# Patient Record
Sex: Female | Born: 1960 | Race: White | Marital: Married | Smoking: Never smoker
Health system: Northeastern US, Academic
[De-identification: ages and names within clinical notes are randomized; demographics above are authoritative.]

## PROBLEM LIST (undated history)

## (undated) DIAGNOSIS — K5792 Diverticulitis of intestine, part unspecified, without perforation or abscess without bleeding: Secondary | ICD-10-CM

## (undated) DIAGNOSIS — J45909 Unspecified asthma, uncomplicated: Secondary | ICD-10-CM

## (undated) DIAGNOSIS — T7840XA Allergy, unspecified, initial encounter: Secondary | ICD-10-CM

## (undated) DIAGNOSIS — M1990 Unspecified osteoarthritis, unspecified site: Secondary | ICD-10-CM

## (undated) DIAGNOSIS — Z8742 Personal history of other diseases of the female genital tract: Secondary | ICD-10-CM

## (undated) DIAGNOSIS — Z8619 Personal history of other infectious and parasitic diseases: Secondary | ICD-10-CM

## (undated) DIAGNOSIS — Z8759 Personal history of other complications of pregnancy, childbirth and the puerperium: Secondary | ICD-10-CM

## (undated) HISTORY — DX: Allergy, unspecified, initial encounter: T78.40XA

## (undated) HISTORY — DX: Unspecified osteoarthritis, unspecified site: M19.90

## (undated) HISTORY — DX: Personal history of other diseases of the female genital tract: Z87.42

## (undated) HISTORY — DX: Unspecified asthma, uncomplicated: J45.909

## (undated) HISTORY — DX: Personal history of other infectious and parasitic diseases: Z86.19

## (undated) HISTORY — DX: Diverticulitis of intestine, part unspecified, without perforation or abscess without bleeding: K57.92

## (undated) HISTORY — PX: COLONOSCOPY: SHX174

## (undated) HISTORY — DX: Personal history of other complications of pregnancy, childbirth and the puerperium: Z87.59

---

## 1981-08-13 HISTORY — PX: DILATION AND CURETTAGE OF UTERUS: SHX78

## 1984-08-13 HISTORY — PX: TUBAL LIGATION: SHX77

## 2012-08-13 HISTORY — PX: CHOLECYSTECTOMY: SHX55

## 2020-03-07 ENCOUNTER — Encounter (HOSPITAL_COMMUNITY): Payer: Self-pay | Admitting: Emergency Medicine

## 2020-03-07 ENCOUNTER — Other Ambulatory Visit: Payer: Self-pay

## 2020-03-07 DIAGNOSIS — M25579 Pain in unspecified ankle and joints of unspecified foot: Secondary | ICD-10-CM | POA: Diagnosis not present

## 2020-03-07 DIAGNOSIS — M79604 Pain in right leg: Secondary | ICD-10-CM | POA: Diagnosis not present

## 2020-03-07 DIAGNOSIS — M2342 Loose body in knee, left knee: Secondary | ICD-10-CM | POA: Diagnosis not present

## 2020-03-07 DIAGNOSIS — M25562 Pain in left knee: Secondary | ICD-10-CM | POA: Insufficient documentation

## 2020-03-07 DIAGNOSIS — M79672 Pain in left foot: Secondary | ICD-10-CM | POA: Diagnosis not present

## 2020-03-07 NOTE — ED Triage Notes (Addendum)
Per [facility] pt heard pop and unable to get shoe on left foot due to pains from left knee all the way to toes. Pt has arthritis.  Vitals with WNL 114/50, 84HR, 18R, 95% temp 98.4.

## 2020-03-08 ENCOUNTER — Emergency Department (HOSPITAL_COMMUNITY): Payer: No Typology Code available for payment source

## 2020-03-08 ENCOUNTER — Emergency Department (HOSPITAL_COMMUNITY)
Admission: EM | Admit: 2020-03-08 | Discharge: 2020-03-08 | Disposition: A | Discharge status: Home / Self Care | Payer: No Typology Code available for payment source | Attending: Emergency Medicine | Admitting: Emergency Medicine

## 2020-03-08 DIAGNOSIS — M25562 Pain in left knee: Secondary | ICD-10-CM

## 2020-03-08 DIAGNOSIS — M2342 Loose body in knee, left knee: Secondary | ICD-10-CM

## 2020-03-08 LAB — I-STAT CHEM 8, ED
BUN: 13 mg/dL (ref 6–20)
Calcium, Ion: 1.18 mmol/L (ref 1.15–1.40)
Chloride: 103 mmol/L (ref 98–111)
Creatinine, Ser: 0.8 mg/dL (ref 0.44–1.00)
Glucose, Bld: 123 mg/dL — ABNORMAL HIGH (ref 70–99)
HCT: 45 % (ref 36.0–46.0)
Hemoglobin: 15.3 g/dL — ABNORMAL HIGH (ref 12.0–15.0)
Potassium: 4.3 mmol/L (ref 3.5–5.1)
Sodium: 141 mmol/L (ref 135–145)
TCO2: 26 mmol/L (ref 22–32)

## 2020-03-08 MED ORDER — TRAMADOL HCL 50 MG PO TABS: 50.0000 mg | ORAL_TABLET | Freq: Four times a day (QID) | ORAL | 0 refills | Status: AC | PRN

## 2020-03-08 MED ORDER — KETOROLAC TROMETHAMINE 15 MG/ML IJ SOLN
15.0000 mg | Freq: Once | INTRAMUSCULAR | Status: AC
  Administered 2020-03-08: 15 mg via INTRAMUSCULAR
  Filled 2020-03-08: qty 1

## 2020-03-08 NOTE — Progress Notes (Signed)
Orthopedic Tech Progress Note Patient Details:  Cindy Parks 1960/12/04 [id number]  Ortho Devices Type of Ortho Device: Knee Immobilizer Ortho Device/Splint Location: Left Knee Ortho Device/Splint Interventions: Ordered, Application, Adjustment   Post Interventions Patient Tolerated: Well Instructions Provided: Care of device, Adjustment of device   Zebedee Iba 03/08/2020, 7:56 AM

## 2020-03-08 NOTE — ED Notes (Signed)
Ortho called 

## 2020-03-08 NOTE — ED Notes (Signed)
Patient transported to X-ray 

## 2020-03-08 NOTE — Discharge Instructions (Addendum)
Elevate your leg, use ice packs over your knee for swelling and comfort.  We gave you Toradol tonight which is a nonsteroidal anti-inflammatory drug similar to ibuprofen, Motrin, Advil, Aleve.  You can try the tramadol which is a mild narcotic for pain.  Or you can take acetaminophen 500 mg every 6 hours by itself or with the tramadol.  Wear the knee immobilizer to support your knee.  Use the crutches until you are able to walk on that leg without pain.  When you return to [place] please have your doctor refer you to an orthopedist for further evaluation.  You may need to have arthroscopy done on your knee.

## 2020-03-08 NOTE — ED Provider Notes (Signed)
[facility]-EMERGENCY DEPT Provider Note   CSN: [id number] Arrival date & time: 03/07/20  1756   Time seen 4:55 AM   History Chief Complaint  Patient presents with  . Knee Pain  . Leg Pain  . Ankle Pain  . Foot Pain    Cindy Parks is a 59 y.o. female.  HPI   Patient states about 4:30 PM she had her left knee flexed and her lower leg resting on the thigh of her right leg putting on her shoe on her left foot and she felt a pop in her left knee.  Since then she has been unable to ambulate on that leg.  She states that she has had pain in that knee for many years and had an x-ray several years ago that showed arthritis.  She has been increasing pain however over the past few weeks.  Patient is visiting from [place].  History reviewed. No pertinent past medical history.  There are no problems to display for this patient.   History reviewed. No pertinent surgical history.   OB History   No obstetric history on file.     No family history on file.  Social History   Tobacco Use  . Smoking status: Not on file  Substance Use Topics  . Alcohol use: Not on file  . Drug use: Not on file    Home Medications Prior to Admission medications   Medication Sig Start Date End Date Taking? Authorizing Provider  traMADol (ULTRAM) 50 MG tablet Take 1 tablet (50 mg total) by mouth every 6 (six) hours as needed for moderate pain or severe pain. 03/08/20   Devoria Albe, MD    Allergies    Erythromycin  Review of Systems   Review of Systems  All other systems reviewed and are negative.   Physical Exam Updated Vital Signs BP (!) 148/73 (BP Location: Right Arm)   Pulse 71   Temp 99.2 F (37.3 C)   Resp 16   Ht 4' 8.75" (1.441 m)   Wt 74.8 kg   SpO2 99%   BMI 36.02 kg/m   Vital signs normal    Physical Exam Vitals and nursing note reviewed.  Constitutional:      Appearance: Normal appearance. She is obese.  HENT:     Head: Normocephalic and  atraumatic.     Right Ear: External ear normal.     Left Ear: External ear normal.  Eyes:     Extraocular Movements: Extraocular movements intact.     Conjunctiva/sclera: Conjunctivae normal.  Cardiovascular:     Rate and Rhythm: Normal rate.  Pulmonary:     Effort: Pulmonary effort is normal. No respiratory distress.  Musculoskeletal:        General: Swelling and tenderness present.     Cervical back: Normal range of motion.     Comments: Patient is noted to have some mild diffuse swelling around her left knee.  There appears to be a possible small effusion by exam.  The patellar appears to be normally placed.  Skin:    General: Skin is warm and dry.     Findings: No erythema.  Neurological:     General: No focal deficit present.     Mental Status: She is alert and oriented to person, place, and time.     Cranial Nerves: No cranial nerve deficit.  Psychiatric:        Mood and Affect: Mood normal.  Behavior: Behavior normal.        Thought Content: Thought content normal.     ED Results / Procedures / Treatments   Labs (all labs ordered are listed, but only abnormal results are displayed) Results for orders placed or performed during the hospital encounter of 03/08/20  I-stat chem 8, ED (not at [facility] or [facility])  Result Value Ref Range   Sodium 141 135 - 145 mmol/L   Potassium 4.3 3.5 - 5.1 mmol/L   Chloride 103 98 - 111 mmol/L   BUN 13 6 - 20 mg/dL   Creatinine, Ser 6.59 0.44 - 1.00 mg/dL   Glucose, Bld 935 (H) 70 - 99 mg/dL   Calcium, Ion 7.01 7.79 - 1.40 mmol/L   TCO2 26 22 - 32 mmol/L   Hemoglobin 15.3 (H) 12.0 - 15.0 g/dL   HCT 39.0 36 - 46 %    Laboratory interpretation all normal except nonfasting hyperglycemia  EKG None  Radiology DG Knee Complete 4 Views Left  Result Date: 03/08/2020 CLINICAL DATA:  Knee popping.  Swelling.  Pain. EXAM: LEFT KNEE - COMPLETE 4+ VIEW COMPARISON:  No prior. FINDINGS: Mild tricompartment degenerative change. Prominent loose  body noted along the posterior aspect of the femoral tibial joint space. No acute bony or joint abnormality identified. No evidence of fracture or dislocation. No prominent effusion. IMPRESSION: Mild tricompartment degenerative change. Prominent loose body noted along the posterior aspect of the femoral tibial joint space. No acute bony abnormality identified. Electronically Signed   By: Maisie Fus  Register   On: 03/08/2020 07:11    Procedures Procedures (including critical care time)  Medications Ordered in ED Medications  ketorolac (TORADOL) 15 MG/ML injection 15 mg (15 mg Intramuscular Given 03/08/20 0532)    ED Course  I have reviewed the triage vital signs and the nursing notes.  Pertinent labs & imaging results that were available during my care of the patient were reviewed by me and considered in my medical decision making (see chart for details).    MDM Rules/Calculators/A&P                          X-rays were obtained of the left knee.  I-STAT 8 was done to check her BUN and creatinine so she could be given Toradol for pain.  After reviewing her i-STAT 8 she was given Toradol IM.  Nurse reports about 15 minutes after she got the Toradol she became hypotensive with blood pressure 96/63 and heart rate dropped to 58 and she told me she had some nausea.  When I went into her room at 6:00 AM her blood pressure was 108 systolic and she states she was feeling better.  Her heart rate was back in the 70s.  We discussed that sounds like she had a vasovagal episode however she probably should avoid Toradol in the future.  Patient is still waiting to have her x-ray done.  Patient has had her x-ray done and patient was given a copy of her report of her x-ray and her lab work since she lives in [place].  We discussed treatment includes a knee immobilizer, crutches, and she states she has a walker at home in [place].  She is going back to [place] on the 31st.  She should follow-up with orthopedist  when she gets back to [place].  We discussed I did not feel like she probably need a knee replacement at this point but she  probably needs arthroscopy to remove the loose body and to see if she has some meniscal or other intra-articular problems going on in her knee.  She does state sometimes her knee locks up.  Final Clinical Impression(s) / ED Diagnoses Final diagnoses:  Acute pain of left knee  Loose body of left knee    Rx / DC Orders ED Discharge Orders         Ordered    traMADol (ULTRAM) 50 MG tablet  Every 6 hours PRN     Discontinue  Reprint     03/08/20 0731        OTC  Acetaminophen  Plan discharge  Devoria Albe, MD, Concha Pyo, MD 03/08/20 [phone]

## 2020-03-16 ENCOUNTER — Other Ambulatory Visit: Payer: Self-pay | Admitting: Obstetrics and Gynecology

## 2020-03-16 LAB — CBC
Baso # K/uL: 0 10*3/uL (ref 0–0.20)
Basophil %: 0.6 % (ref 0–1)
Eos # K/uL: 0.1 10*3/uL (ref 0–0.45)
Eosinophil %: 2.2 % (ref 0–5)
Hematocrit: 43.2 % (ref 37.0–47.0)
Hemoglobin: 14.3 gm/DL (ref 12.0–18.0)
IMM Granulocytes: 0.2 % (ref 0–1)
Immature Granulocytes Absolute: 0.01 10*3/uL
Lymph # K/uL: 2.2 10*3/uL (ref 1.0–4.8)
Lymphocyte %: 34.1 % (ref 25.0–45.0)
MCH: 31.3 UUG (ref 29–32)
MCHC: 33.1 g/dl (ref 30–36)
MCV: 94.5 fl (ref 86.5–98.5)
Mono # K/uL: 0.5 10*3/uL (ref 0–0.80)
Monocyte %: 8.4 % (ref 1.7–9.3)
Neut # K/uL: 3.4 10*3/uL (ref 1.8–7.8)
Nucl RBC # K/uL: 0 10*3/uL (ref 0.0–0.0)
Nucl RBC %: 0 % (ref 0–2)
Platelets: 273 10*3/uL (ref 150–400)
RBC: 4.57 M/uL (ref 4.0–5.5)
RDW: 12.7 % (ref 11.5–14.5)
Seg Neut %: 54.5 % (ref 40–60)
WBC: 6.3 10*3/uL (ref 5.0–11.0)

## 2020-03-29 ENCOUNTER — Other Ambulatory Visit
Admission: RE | Admit: 2020-03-29 | Discharge: 2020-03-29 | Disposition: A | Discharge status: Home / Self Care | Payer: Non-veteran care | Source: Ambulatory Visit

## 2020-03-29 HISTORY — PX: DILATION AND CURETTAGE OF UTERUS: SHX78

## 2020-04-07 LAB — SURGICAL PATHOLOGY

## 2020-04-28 ENCOUNTER — Encounter: Payer: Self-pay | Admitting: Obstetrics and Gynecology

## 2020-04-28 NOTE — Progress Notes (Signed)
Patient Intake    Problem    Problems/Comments    Patient presents to the office for a follow up on a D C done 03/29/20 at [facility].   Patient states that at  times she experiences "light bleeding" and is able to wear just a panty liner.   Patient denies any   other concerns at this time.        Medication list    Medications    Last Reconciled on 04/28/20 14:36 by Esmond Plants MD    Meloxicam (Meloxicam) Unknown Strength Cap    PO HS, CAP       Reported         02/18/20     Cholecalciferol (Vitamin D3 2000 units) 2,000 Unit Tab    4000 UNITS PO HS, TAB       Reported         02/18/20     Albuterol Sulfate (Albuterol MDI) 108 Mcg/Act Aer    1 PUFF INH PRN, #1 AER       Reported         02/18/20     Cetirizine HCl (Zyrtec 10 mg) Unknown Strength Chw    10 MG PO HS, TAB       Reported         02/18/20     Venlafaxine Hydrochloride (Venlafaxine HCl) Unknown Strength Tab    PO DAILY, #30 TAB       Reported         02/18/20    PMP Checked:  Not Applicable    Nurse update/review meds w/Pt:  Yes    Date updated/reviewed:  Apr 28, 2020        Medication Management    Understands current meds:  Yes    Date reviewed w/pt, family:  Apr 28, 2020    Acknowledg barrier 2 adherence:  N/A    Date barriers identified:  Apr 28, 2020        Drug Abuse Screening Test    Date completed:  Feb 18, 2020        Allergies    Allergies:      Coded Allergies:           Latex (Verified  Allergy, Unknown, HIVES, 02/18/20)        Vitals    Vitals:           Height 4 ft 8.75 in / 144.15 cm         Weight 163 lbs  / 14.431540 kg         BSA 1.72 m2         BMI 35.6 kg/m2         Temperature 97.1 F / 36.17 C - Temporal         Pulse 90         Respirations 18         Blood Pressure 122/78 Sitting, Right Arm         Pulse Oximetry 98%, room air    O2 Delivery Method:  Room Air    Systolic <130 mmHg:  Yes    Diastolic <80 mmHg:  Yes        Barriers / Communication Needs    Language Barriers:  No    Preferred Language:  English    Hearing Impairment:  No    Visual  Impairment:  Yes (Wears glasses)    Cognitive deficits:  No    HIV Screening Offered:  No    HEP C Screening Offered:  No        Pain Scale    Is the patient having any pain:  No        Self Referral since last visit    Patient self referred?:  No        Educ: Discussed/Distributed    Education Discussed with Pt:  Yes    Educational Materials:  Medication, Follow up visit, Vital signs        PHQ-2 Depression Screening    Instructions    Over the last 2 weeks, how often have you been bothered by any of the   following problems?  (Please   circle your response)    Established Patient Assessment:  Routine Vitals, Check-In, Review of Past   Record    Patient monitoring:  Pulse Ox        Nurse Code Visit    [phone] BP,Wt Chk,Pill Ct, Etc:  No        Performing Nurse:    Performing Nurse:    04/28/20 12:39  Deanna Fanton-Lpn        HPI-GYN    History of Present Illness    Details    59 year old G3 P77 Caucasian female on no HRT presents to review results of   her diagnostic   hysteroscopy and fractional dilatation curettage performed on 03/29/2020 due to   her reported painless  vaginal staining of "brown blood."  A preoperative pelvic sonogram had   identified a 7.3 x 5.5 mm   myoma within the mid to lower uterine segment.  Hysteroscopy failed to   identify any intrauterine   mass occupying lesion(s).  No glands were identified.  A fractional dilatation     curettage was   nevertheless performed.          Endometrial curettings identified mainly mucus with fragments of ectocervical   squamous mucosa and   endocervical glands with no dysplasia; however, insufficient endometrium was   retrieved for an   official endometrial diagnosis.  The endocervical curettage produced lower   uterine segment glands   and ectocervical squamous mucosa with no dysplasia. Ms. Paganelli declined   speculum exam. She denies  any subsequent vaginal bleeding.         Mrs. Kugler was instructed in careful self breast examination techniques.    She will be undergoing   repeat yearly screening mammography in February 2022, but in-between yearly   mammographies she agreed  to perform SBE as instructed today. Mrs. Kate Sable was cautioned she might   anticipate further brown   staining per vagina given her postmenopausal, estrogen deprived and   sonographically confirmed   uterine myoma        Past, Family,   Social History    Tobacco Use    Tobacco status:  Never smoker        Alcohol Use    Alcohol intake:  None        Substance Use    Substance use:  Denies use        Past Medical History    Respiratory:  REPORTS HX OF: Allergies/hay fever, Asthma    Gastrointestinal:  REPORTS HX OF: Diverticulitis    Gynecologic:  REPORTS HX OF: Abnormal pap smear, Pap Smear, Mammogram    Age at menarche:  89    Age at menopause:  25    Musculoskeletal:  REPORTS HX OF: Osteoarthritis    Infectious disease:  REPORTS  HX OF: Chickenpox    Pregnancy history:         Gravida:  3       Para:  2       Live births:  2       SAB/TAB (detail in comments):  Yes        Past Surgical History    Gastrointestinal:  REPORTS HX OF: Cholecystectomy (2014), Colonoscopy    Gynecologic:  REPORTS HX OF: D C (1983), Tubal ligation (1986- Dr.Yang)        Family Medical History    Family history - cancer:  REPORTS HX OF: Breast cancer (Maternal aunt, Sister   and Niece)        ROS    Negative ROS:  Nothing on open ROS        GYN Exam    Constitutional    General appearance:  Comfortable    Nutritional status:  Obese (BMI 35.6 kg/m)    Orientation:  A/O x3        Skin    Skin:  FINDINGS: normal color, Warm/Dry    Hair:  Normal        Head, Eyes, Ears, Nose, Throat    Head:  normocephalic/atraumatic    Eyes:  Conjunctivae clear    Ears, nose, mouth, throat:  no ear deformities    Hearing, right ear:  Grossly Normal    Hearing, left ear:  Grossly Normal    Mouth/oropharynx:         Lips:  FINDINGS: other (Masks)        Breast    Breast appearance:         Symmetry:  Declines breast exam        Skin:  Bilateral: Other (2022 yearly mammo-referral issued)        Respiratory    Respiratory effort:  FINDINGS: normal        Cardiovascular    Rhythm:  Regular        Gastrointestinal    Abdomen description:  Normal    Abdominal palpation:         Abdomen:  Soft       LUQ:  Non-tender       LLQ:  Non-tender       RUQ:  Non-tender       RLQ:  Non-tender       Periumbilical:  Non-tender    Ascites:  Absent        Genitourinary    External genitalia:         Labia:  Other (Declines exam)    POC [place]:  No    POC KOH:  No        Assessment/Plan -    Problem List and Billing Codes:  Yes        GYN Billing Codes    Breast   Cervical Screen:  No        OB Billing Codes    Post Operative:  Yes                <Electronically signed by Brigitte Pulse MD>        04/28/20 1436

## 2020-11-25 ENCOUNTER — Encounter: Payer: Self-pay | Admitting: Obstetrics and Gynecology

## 2020-11-25 DIAGNOSIS — N882 Stricture and stenosis of cervix uteri: Secondary | ICD-10-CM | POA: Insufficient documentation

## 2020-11-25 DIAGNOSIS — N8111 Cystocele, midline: Secondary | ICD-10-CM | POA: Insufficient documentation

## 2020-11-25 DIAGNOSIS — Z6836 Body mass index (BMI) 36.0-36.9, adult: Secondary | ICD-10-CM | POA: Insufficient documentation

## 2020-11-25 NOTE — NoShare Progress Note (Signed)
The following documentation has been abstracted to the chart from [place] - allergies, problem list and immunization on date 11/25/20.

## 2020-12-14 NOTE — NoShare Progress Note (Signed)
The following documentation has been abstracted to the chart from [place] - medications on date 12/14/2020.

## 2021-04-21 ENCOUNTER — Encounter: Payer: Self-pay | Admitting: Obstetrics and Gynecology

## 2021-04-21 DIAGNOSIS — M1990 Unspecified osteoarthritis, unspecified site: Secondary | ICD-10-CM | POA: Insufficient documentation

## 2021-04-21 DIAGNOSIS — J45909 Unspecified asthma, uncomplicated: Secondary | ICD-10-CM | POA: Insufficient documentation

## 2021-04-21 DIAGNOSIS — Z8759 Personal history of other complications of pregnancy, childbirth and the puerperium: Secondary | ICD-10-CM | POA: Insufficient documentation

## 2021-04-21 DIAGNOSIS — Z8742 Personal history of other diseases of the female genital tract: Secondary | ICD-10-CM

## 2021-04-21 DIAGNOSIS — K5792 Diverticulitis of intestine, part unspecified, without perforation or abscess without bleeding: Secondary | ICD-10-CM | POA: Insufficient documentation

## 2021-04-21 DIAGNOSIS — T7840XA Allergy, unspecified, initial encounter: Secondary | ICD-10-CM | POA: Insufficient documentation

## 2021-04-21 DIAGNOSIS — Z8619 Personal history of other infectious and parasitic diseases: Secondary | ICD-10-CM | POA: Insufficient documentation

## 2021-04-21 NOTE — Communication Body (Signed)
The following documentation has been abstracted to the chart from [place] - allergies, medications, problem list and immunization on 04/21/21.

## 2021-05-01 ENCOUNTER — Ambulatory Visit: Payer: Non-veteran care | Admitting: Obstetrics and Gynecology

## 2021-12-09 IMAGING — CR DG KNEE COMPLETE 4+V*L*
4 series · 4 of 4 positions shown · non-contrast
Comparison: No prior.

CLINICAL DATA: Knee popping.  Swelling.  Pain.

EXAM:
LEFT KNEE - COMPLETE 4+ VIEW

[x knee ap left (1 of 4)]
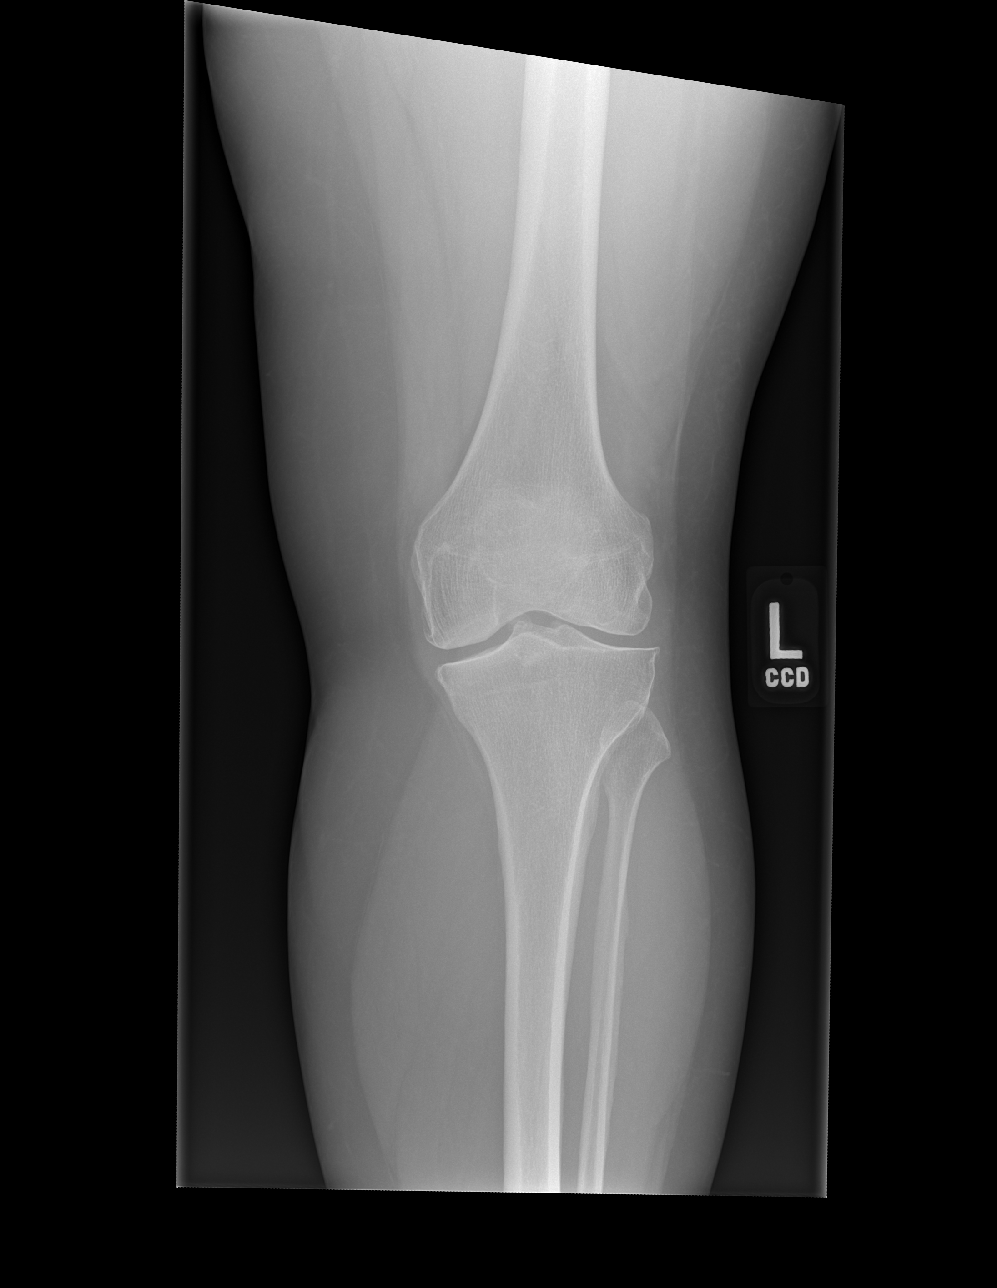

[x knee ap left (2 of 4)]
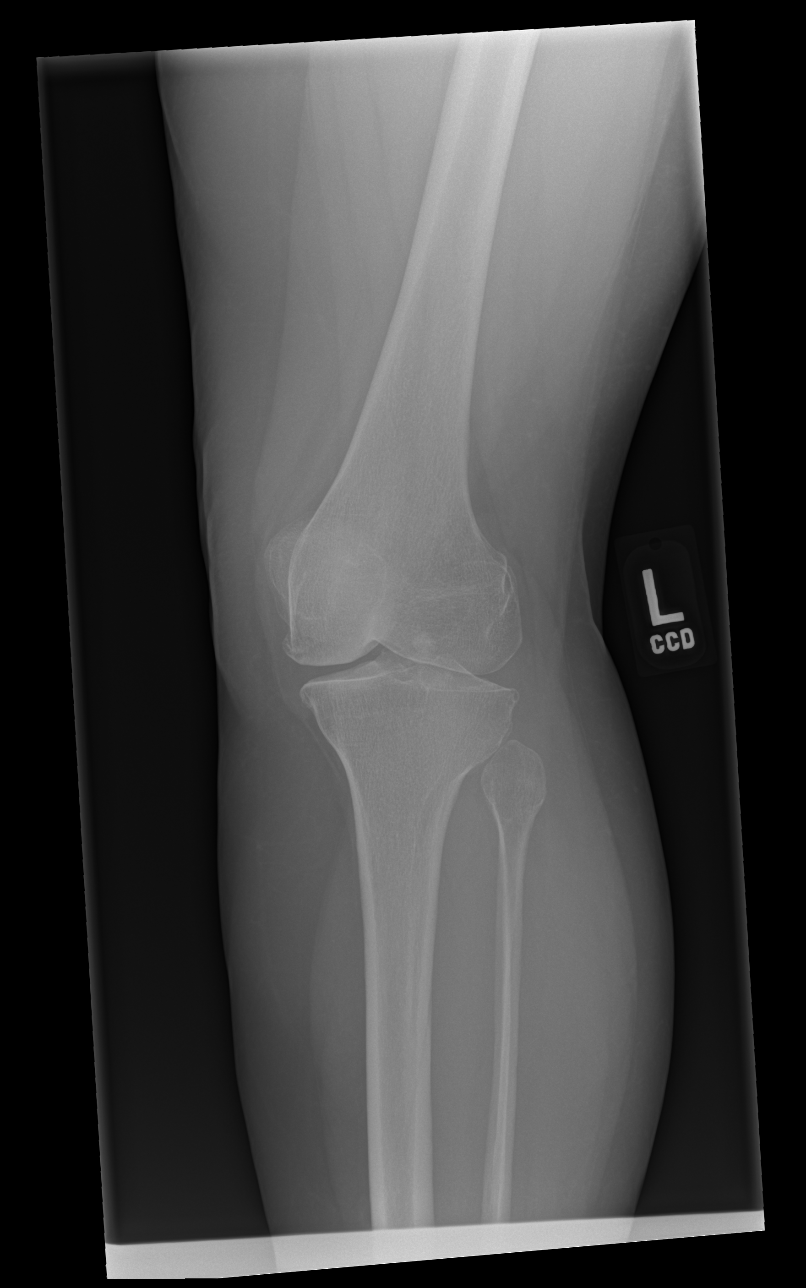

[x knee ap left (3 of 4)]
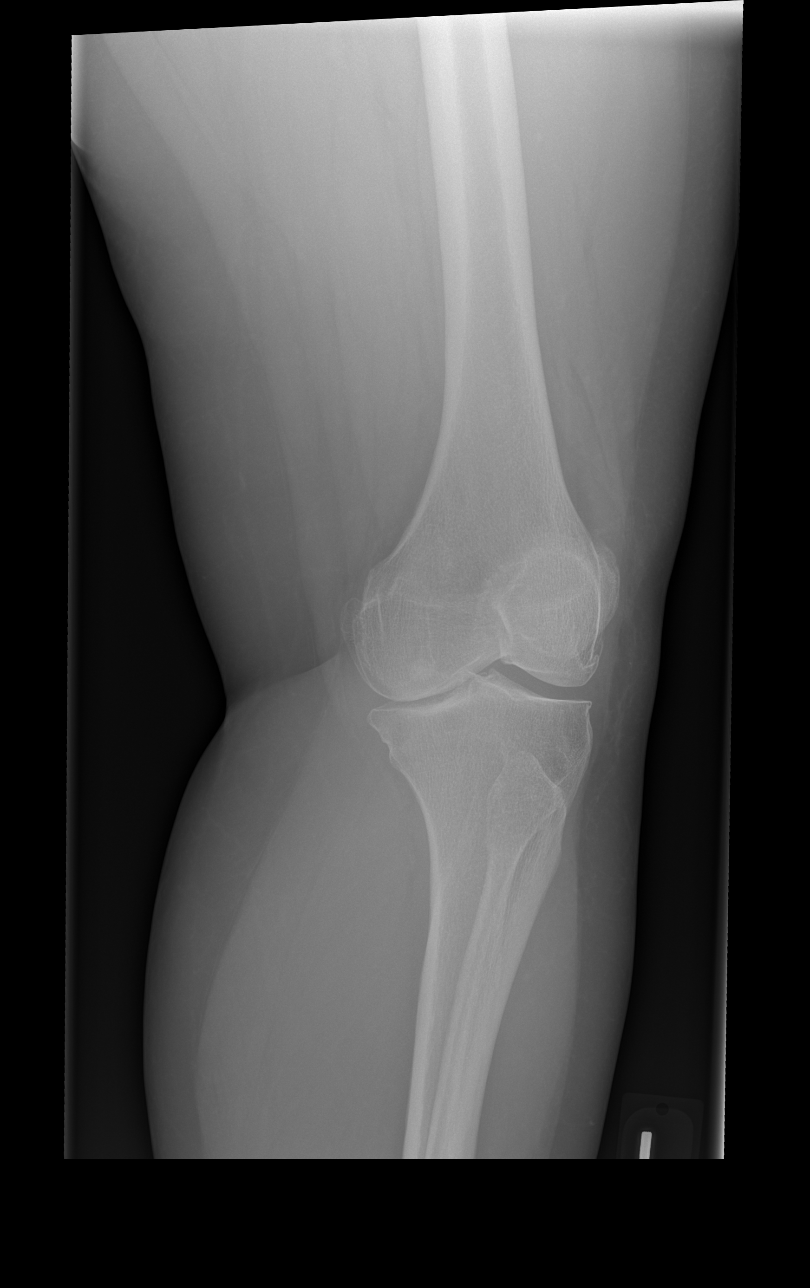

[x knee ap left (4 of 4)]
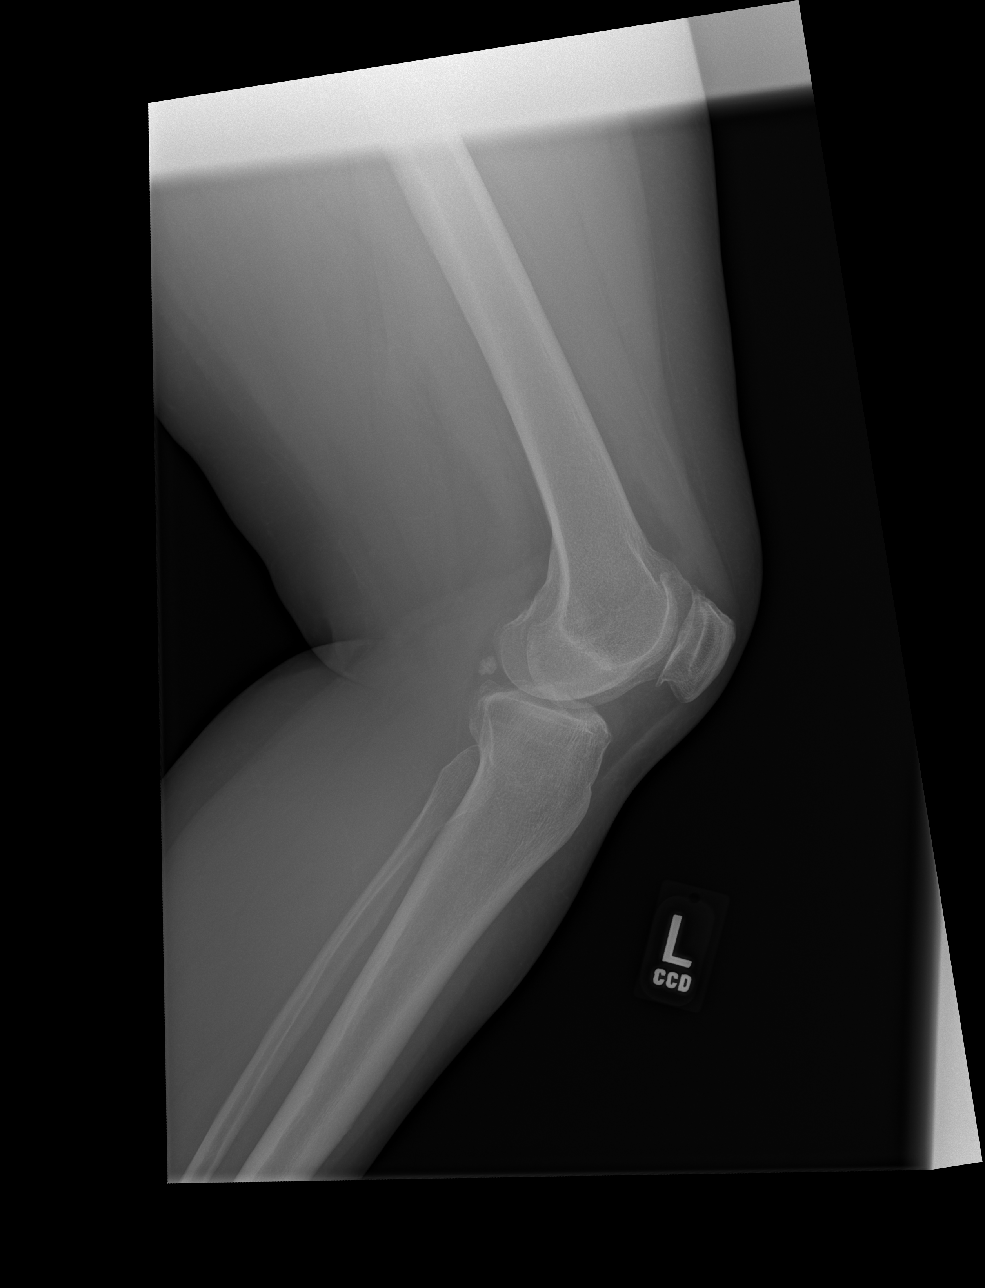

[4 of 4 positions shown; findings below may reference images not displayed]

FINDINGS: Mild tricompartment degenerative change. Prominent loose body noted
along the posterior aspect of the femoral tibial joint space. No
acute bony or joint abnormality identified. No evidence of fracture
or dislocation. No prominent effusion.
IMPRESSION: Mild tricompartment degenerative change. Prominent loose body noted
along the posterior aspect of the femoral tibial joint space. No
acute bony abnormality identified.

## 2024-01-26 ENCOUNTER — Emergency Department
Admission: EM | Admit: 2024-01-26 | Discharge: 2024-01-27 | Disposition: A | Discharge status: Home / Self Care | Source: Ambulatory Visit | Attending: Emergency Medicine | Admitting: Emergency Medicine

## 2024-01-26 ENCOUNTER — Encounter: Payer: Self-pay | Admitting: Emergency Medicine

## 2024-01-26 ENCOUNTER — Other Ambulatory Visit: Payer: Self-pay

## 2024-01-26 DIAGNOSIS — T781XXA Other adverse food reactions, not elsewhere classified, initial encounter: Secondary | ICD-10-CM | POA: Insufficient documentation

## 2024-01-26 DIAGNOSIS — Y9389 Activity, other specified: Secondary | ICD-10-CM | POA: Insufficient documentation

## 2024-01-26 DIAGNOSIS — T782XXA Anaphylactic shock, unspecified, initial encounter: Secondary | ICD-10-CM

## 2024-01-26 DIAGNOSIS — Z91018 Allergy to other foods: Secondary | ICD-10-CM | POA: Insufficient documentation

## 2024-01-26 DIAGNOSIS — Y9289 Other specified places as the place of occurrence of the external cause: Secondary | ICD-10-CM | POA: Insufficient documentation

## 2024-01-26 DIAGNOSIS — R600 Localized edema: Secondary | ICD-10-CM | POA: Insufficient documentation

## 2024-01-26 DIAGNOSIS — Y998 Other external cause status: Secondary | ICD-10-CM | POA: Insufficient documentation

## 2024-01-26 DIAGNOSIS — X58XXXA Exposure to other specified factors, initial encounter: Secondary | ICD-10-CM | POA: Insufficient documentation

## 2024-01-26 MED ORDER — DIPHENHYDRAMINE HCL 50 MG/ML IJ SOLN *I*
25.0000 mg | Freq: Once | INTRAMUSCULAR | Status: AC
Start: 2024-01-26 — End: 2024-01-26
  Administered 2024-01-26: 25 mg via INTRAVENOUS
  Filled 2024-01-26: qty 1

## 2024-01-26 MED ORDER — FAMOTIDINE (PF) 20 MG/2ML IV SOLN *I*
20.0000 mg | Freq: Once | INTRAVENOUS | Status: AC
Start: 2024-01-26 — End: 2024-01-26
  Administered 2024-01-26: 20 mg via INTRAVENOUS
  Filled 2024-01-26: qty 2

## 2024-01-26 MED ORDER — DEXAMETHASONE SOD PHOSPHATE PF 10 MG/ML IJ SOLN *I*
10.0000 mg | Freq: Once | INTRAMUSCULAR | Status: AC
Start: 2024-01-26 — End: 2024-01-26
  Administered 2024-01-26: 10 mg via INTRAVENOUS
  Filled 2024-01-26: qty 1

## 2024-01-26 MED ORDER — PREDNISONE 10 MG PO TABS *I*
10.0000 mg | ORAL_TABLET | Freq: Every day | ORAL | 0 refills | Status: AC
Start: 2024-01-26 — End: ?

## 2024-01-26 MED ORDER — EPINEPHRINE 0.3 MG/0.3ML IJ SOAJ *I*
0.3000 mg | INTRAMUSCULAR | 0 refills | Status: AC | PRN
Start: 2024-01-26 — End: ?

## 2024-01-26 MED ORDER — EPINEPHRINE 1 MG/ML IJ SOLUTION WRAPPED *I*
0.3000 mg | Freq: Once | INTRAMUSCULAR | Status: AC
Start: 2024-01-26 — End: 2024-01-26
  Administered 2024-01-26: 0.3 mg via INTRAMUSCULAR
  Filled 2024-01-26: qty 1

## 2024-01-26 NOTE — ED Triage Notes (Signed)
 Patient arrived c.o an allergic rx. Patient reports she bit into a cookie that had walnuts in it. Patient reports her tongue is swelling reports chest tightness and tingling in her fingers.

## 2024-01-26 NOTE — Discharge Instructions (Signed)
 You are seen at [facility] for allergic reaction.  You were treated with an EpiPen with improvement of your symptoms.  We recommend using steroids for the next 4 days along with outpatient follow-up with your primary care physician.  Please return to the emerged part if you develop worsening symptoms or discomfort.

## 2024-01-26 NOTE — ED Provider Notes (Signed)
 History     Chief Complaint   Patient presents with    Allergic Reaction       History provided by:  Patient    63 year old female presenting for allergic reaction.  Acute onset prior arrival the patient was exposed to walnuts which she is allergic to.  She developed sore throat along with swelling in her mouth.  She is also noted some chest tightness.  Denies nausea vomiting diarrhea or constipation.  Denies rash.  No EpiPen use.    Medical/Surgical/Family History     Past Medical History:   Diagnosis Date    Allergies     hayfever    Asthma     Diverticulitis     History of abnormal cervical Pap smear     History of chicken pox     History of miscarriage     Osteoarthritis         Patient Active Problem List   Diagnosis Code    Stenosis of cervix N88.2    BMI 36.0-36.9,adult Z68.36    Midline cystocele N81.11    Allergies T78.40XA    Asthma J45.909    Diverticulitis K57.92    History of abnormal cervical Pap smear Z87.42    Osteoarthritis M19.90    History of chicken pox Z86.19    History of miscarriage Z87.59            Past Surgical History:   Procedure Laterality Date    CHOLECYSTECTOMY  2014    COLONOSCOPY      DILATION AND CURETTAGE OF UTERUS  03/29/2020    Dr. Wilhemenia Hard: Diagnostic hysteroscopy with d&c, Dx: PMB    DILATION AND CURETTAGE OF UTERUS  1983    miscarriage    TUBAL LIGATION Bilateral 1986    Dr. Fernand Howard          Social History[1]          Review of Systems    Physical Exam     Triage Vitals     First Recorded BP: 123/72, Resp: 22, Temp: 36.4 C (97.5 F), Temp src: TEMPORAL Oxygen Therapy SpO2: 97 %, Heart Rate: 80, (01/26/24 2051) Heart Rate (via Pulse Ox): 83, (01/26/24 2051).  First Pain Reported  0-10 Scale: 0, (01/26/24 2051)       Physical Exam  Vitals and nursing note reviewed.   Constitutional:       Appearance: Normal appearance.   HENT:      Head: Normocephalic.      Right Ear: Tympanic membrane and external ear normal.      Left Ear: Tympanic membrane and external ear normal.      Nose:  Nose normal.      Mouth/Throat:      Mouth: Mucous membranes are moist.      Comments: Uvular edema noted on exam, no uvular deviation  Eyes:      Extraocular Movements: Extraocular movements intact.   Cardiovascular:      Rate and Rhythm: Normal rate and regular rhythm.      Pulses: Normal pulses.   Pulmonary:      Effort: Pulmonary effort is normal.      Breath sounds: Normal breath sounds. No wheezing, rhonchi or rales.   Abdominal:      General: Abdomen is flat.      Palpations: Abdomen is soft.      Tenderness: There is no abdominal tenderness.   Neurological:      Mental Status: She is alert.  Medical Decision Making   Patient seen by me on:  01/26/2024    Assessment:  44 female presenting for allergic reaction.  Vital stable on arrival.  Exam as above.  Concern for anaphylaxis.  Will treat with EpiPen, steroids, Decadron, Pepcid.  Low concern for angioedema given known allergen exposure.    Differential diagnosis:  As Above    Plan:  Orders Placed This Encounter      EPINEPHrine 1 mg/mL injection 0.3 mg      dexAMETHasone PF (DECADRON) injection 10 mg      famotidine (PEPCID) 20 mg/2 mL injection 20 mg      diphenhydrAMINE (BENADRYL) 50 mg/mL injection 25 mg      ED Course and Disposition:  Patient on reevaluation had complete resolution of symptoms.  She was resting comfortably in bed.  Discussed EpiPen with the patient.  She is comfortable plan for discharge and outpatient follow-up with her primary care physician.  She was ambulatory out of the ED at time of discharge.           Marylu Soda, DO              [1]   Social History  Tobacco Use    Smoking status: Never    Smokeless tobacco: Never   Substance Use Topics    Alcohol use: Not Currently    Drug use: Never        Marylu Soda, DO  01/27/24 1610

## 8387-04-14 DEATH — deceased
